# Patient Record
Sex: Female | Born: 1995 | Race: White | Hispanic: No | Marital: Married | Smoking: Never smoker
Health system: Southern US, Community
[De-identification: ages and names within clinical notes are randomized; demographics above are authoritative.]

## PROBLEM LIST (undated history)

## (undated) DIAGNOSIS — D649 Anemia, unspecified: Secondary | ICD-10-CM

## (undated) DIAGNOSIS — F32A Depression, unspecified: Secondary | ICD-10-CM

## (undated) DIAGNOSIS — F329 Major depressive disorder, single episode, unspecified: Secondary | ICD-10-CM

## (undated) HISTORY — DX: Major depressive disorder, single episode, unspecified: F32.9

## (undated) HISTORY — DX: Depression, unspecified: F32.A

## (undated) HISTORY — PX: EYE SURGERY: SHX253

---

## 2016-06-25 LAB — OB RESULTS CONSOLE GBS: GBS: POSITIVE

## 2016-06-25 LAB — OB RESULTS CONSOLE HEPATITIS B SURFACE ANTIGEN: HEP B S AG: NEGATIVE

## 2016-07-05 LAB — OB RESULTS CONSOLE GC/CHLAMYDIA
CHLAMYDIA, DNA PROBE: NEGATIVE
GC PROBE AMP, GENITAL: NEGATIVE

## 2016-09-05 ENCOUNTER — Telehealth: Payer: Self-pay | Admitting: Obstetrics & Gynecology

## 2016-09-05 ENCOUNTER — Telehealth: Payer: Self-pay | Admitting: Certified Nurse Midwife

## 2016-09-05 NOTE — Telephone Encounter (Signed)
Patient is being referred by [facility]. While scheduling appointment Patient refused to schedule X3 due to upcoming work schedule due to her moving. Then when I attempted to schedule patient with for the 4th time with an MD patient said " so I am going to be "profanity" poked by a female Doctor. Pt stated she would be transferring to an other facility.

## 2016-09-05 NOTE — Telephone Encounter (Signed)
Patient lvm to schedule new patient appointment, I called back and lvm to schedule appointment.Thank you.

## 2016-09-05 NOTE — Telephone Encounter (Signed)
Spoke with Maralyn SagoSarah and notified them of patients request to refuse transferring of care

## 2016-11-27 ENCOUNTER — Other Ambulatory Visit: Payer: Self-pay | Admitting: Nurse Practitioner

## 2016-11-27 DIAGNOSIS — Z3402 Encounter for supervision of normal first pregnancy, second trimester: Secondary | ICD-10-CM

## 2016-12-04 ENCOUNTER — Ambulatory Visit
Admission: RE | Admit: 2016-12-04 | Discharge: 2016-12-04 | Disposition: A | Discharge status: Home / Self Care | Payer: Medicaid Other | Source: Ambulatory Visit | Attending: Nurse Practitioner | Admitting: Nurse Practitioner

## 2016-12-04 DIAGNOSIS — Z3689 Encounter for other specified antenatal screening: Secondary | ICD-10-CM | POA: Insufficient documentation

## 2016-12-04 DIAGNOSIS — Z3402 Encounter for supervision of normal first pregnancy, second trimester: Secondary | ICD-10-CM

## 2016-12-05 LAB — OB RESULTS CONSOLE HIV ANTIBODY (ROUTINE TESTING): HIV: NONREACTIVE

## 2016-12-06 LAB — OB RESULTS CONSOLE RPR: RPR: NONREACTIVE

## 2017-02-01 LAB — OB RESULTS CONSOLE GC/CHLAMYDIA
Chlamydia: NEGATIVE
GC PROBE AMP, GENITAL: NEGATIVE

## 2017-02-19 NOTE — L&D Delivery Note (Signed)
       Delivery Note   Marcia Martinez is a 22 y.o. G1P0 at 850w5d Estimated Date of Delivery: 02/26/17  PRE-OPERATIVE DIAGNOSIS:  1) 8950w5d pregnancy.   POST-OPERATIVE DIAGNOSIS:  1) 8150w5d pregnancy s/p Vaginal, Spontaneous   Delivery Type: Vaginal, Spontaneous    Delivery Anesthesia: Epidural   Labor Complications:       ESTIMATED BLOOD LOSS: 175  ml    FINDINGS:   1) female infant, Apgar scores of 8    at 1 minute and 9    at 5 minutes and a birthweight of    ounces.    2) Nuchal cord: No  SPECIMENS:   PLACENTA:   Appearance:      Removal: Spontaneous      Disposition:     DISPOSITION:  Infant to left in stable condition in the delivery room, with L&D personnel and mother,  NARRATIVE SUMMARY: Labor course:  Ms. Marcia Dartingshley Delo is a G1P0 at 7550w5d who presented for induction of labor.  She progressed well in labor without pitocin.  She received the appropriate anesthesia and proceeded to complete dilation. She evidenced good maternal expulsive effort during the second stage. She went on to deliver a viable infant. The placenta delivered without problems and was noted to be complete. A perineal and vaginal examination was performed. Episiotomy/Lacerations: None  Episiotomy or lacerations were repaired with Vicryl suture using local anesthesia. The patient tolerated this well.  Elonda Huskyavid J. Evans, M.D. 02/24/2017 5:44 PM

## 2017-02-24 ENCOUNTER — Inpatient Hospital Stay: Payer: Medicaid Other | Admitting: Anesthesiology

## 2017-02-24 ENCOUNTER — Other Ambulatory Visit: Payer: Self-pay

## 2017-02-24 ENCOUNTER — Inpatient Hospital Stay
Admission: AD | Admit: 2017-02-24 | Discharge: 2017-02-26 | DRG: 807 | Disposition: A | Discharge status: Home / Self Care | Payer: Medicaid Other | Source: Ambulatory Visit | Attending: Obstetrics and Gynecology | Admitting: Obstetrics and Gynecology

## 2017-02-24 DIAGNOSIS — O99824 Streptococcus B carrier state complicating childbirth: Principal | ICD-10-CM | POA: Diagnosis present

## 2017-02-24 DIAGNOSIS — Z3483 Encounter for supervision of other normal pregnancy, third trimester: Secondary | ICD-10-CM | POA: Diagnosis present

## 2017-02-24 DIAGNOSIS — Z3A39 39 weeks gestation of pregnancy: Secondary | ICD-10-CM

## 2017-02-24 HISTORY — DX: Anemia, unspecified: D64.9

## 2017-02-24 LAB — CBC
HEMATOCRIT: 33.4 % — AB (ref 35.0–47.0)
HEMOGLOBIN: 11.4 g/dL — AB (ref 12.0–16.0)
MCH: 31.6 pg (ref 26.0–34.0)
MCHC: 34.2 g/dL (ref 32.0–36.0)
MCV: 92.5 fL (ref 80.0–100.0)
Platelets: 257 10*3/uL (ref 150–440)
RBC: 3.61 MIL/uL — ABNORMAL LOW (ref 3.80–5.20)
RDW: 14.1 % (ref 11.5–14.5)
WBC: 11.6 10*3/uL — ABNORMAL HIGH (ref 3.6–11.0)

## 2017-02-24 LAB — TYPE AND SCREEN
ABO/RH(D): A POS
Antibody Screen: NEGATIVE

## 2017-02-24 LAB — RAPID HIV SCREEN (HIV 1/2 AB+AG)
HIV 1/2 ANTIBODIES: NONREACTIVE
HIV-1 P24 Antigen - HIV24: NONREACTIVE

## 2017-02-24 MED ORDER — EPHEDRINE 5 MG/ML INJ
10.0000 mg | INTRAVENOUS | Status: DC | PRN
  Filled 2017-02-24: qty 2

## 2017-02-24 MED ORDER — DIPHENHYDRAMINE HCL 50 MG/ML IJ SOLN: 12.5000 mg | INTRAMUSCULAR | Status: DC | PRN

## 2017-02-24 MED ORDER — SIMETHICONE 80 MG PO CHEW: 80.0000 mg | CHEWABLE_TABLET | ORAL | Status: DC | PRN

## 2017-02-24 MED ORDER — FENTANYL 2.5 MCG/ML W/ROPIVACAINE 0.15% IN NS 100 ML EPIDURAL (ARMC)
EPIDURAL | Status: AC
  Filled 2017-02-24: qty 100

## 2017-02-24 MED ORDER — CLINDAMYCIN PHOSPHATE 900 MG/50ML IV SOLN
900.0000 mg | Freq: Once | INTRAVENOUS | Status: AC
  Administered 2017-02-24: 900 mg via INTRAVENOUS
  Filled 2017-02-24: qty 50

## 2017-02-24 MED ORDER — MISOPROSTOL 200 MCG PO TABS
ORAL_TABLET | ORAL | Status: AC
  Filled 2017-02-24: qty 4

## 2017-02-24 MED ORDER — OXYTOCIN BOLUS FROM INFUSION
500.0000 mL | Freq: Once | INTRAVENOUS | Status: AC
  Administered 2017-02-24: 500 mL via INTRAVENOUS

## 2017-02-24 MED ORDER — SOD CITRATE-CITRIC ACID 500-334 MG/5ML PO SOLN: 30.0000 mL | ORAL | Status: DC | PRN

## 2017-02-24 MED ORDER — TETANUS-DIPHTH-ACELL PERTUSSIS 5-2.5-18.5 LF-MCG/0.5 IM SUSP: 0.5000 mL | Freq: Once | INTRAMUSCULAR | Status: DC

## 2017-02-24 MED ORDER — IBUPROFEN 600 MG PO TABS
600.0000 mg | ORAL_TABLET | Freq: Four times a day (QID) | ORAL | Status: DC
  Administered 2017-02-24 – 2017-02-26 (×7): 600 mg via ORAL
  Filled 2017-02-24 (×8): qty 1

## 2017-02-24 MED ORDER — DOCUSATE SODIUM 100 MG PO CAPS
100.0000 mg | ORAL_CAPSULE | Freq: Two times a day (BID) | ORAL | Status: DC
  Administered 2017-02-24 – 2017-02-26 (×4): 100 mg via ORAL
  Filled 2017-02-24 (×4): qty 1

## 2017-02-24 MED ORDER — OXYCODONE-ACETAMINOPHEN 5-325 MG PO TABS: 1.0000 | ORAL_TABLET | ORAL | Status: DC | PRN

## 2017-02-24 MED ORDER — OXYTOCIN 10 UNIT/ML IJ SOLN
INTRAMUSCULAR | Status: AC
  Filled 2017-02-24: qty 2

## 2017-02-24 MED ORDER — LACTATED RINGERS IV SOLN: 500.0000 mL | Freq: Once | INTRAVENOUS | Status: DC

## 2017-02-24 MED ORDER — FENTANYL 2.5 MCG/ML W/ROPIVACAINE 0.15% IN NS 100 ML EPIDURAL (ARMC)
12.0000 mL/h | EPIDURAL | Status: DC
  Administered 2017-02-24: 12 mL/h via EPIDURAL

## 2017-02-24 MED ORDER — ZOLPIDEM TARTRATE 5 MG PO TABS: 5.0000 mg | ORAL_TABLET | Freq: Every evening | ORAL | Status: DC | PRN

## 2017-02-24 MED ORDER — PHENYLEPHRINE 40 MCG/ML (10ML) SYRINGE FOR IV PUSH (FOR BLOOD PRESSURE SUPPORT)
80.0000 ug | PREFILLED_SYRINGE | INTRAVENOUS | Status: DC | PRN
  Filled 2017-02-24: qty 5

## 2017-02-24 MED ORDER — DIPHENHYDRAMINE HCL 25 MG PO CAPS: 25.0000 mg | ORAL_CAPSULE | Freq: Four times a day (QID) | ORAL | Status: DC | PRN

## 2017-02-24 MED ORDER — AMMONIA AROMATIC IN INHA
RESPIRATORY_TRACT | Status: AC
  Filled 2017-02-24: qty 10

## 2017-02-24 MED ORDER — ACETAMINOPHEN 325 MG PO TABS: 650.0000 mg | ORAL_TABLET | ORAL | Status: DC | PRN

## 2017-02-24 MED ORDER — ONDANSETRON HCL 4 MG/2ML IJ SOLN: 4.0000 mg | Freq: Four times a day (QID) | INTRAMUSCULAR | Status: DC | PRN

## 2017-02-24 MED ORDER — PRENATAL MULTIVITAMIN CH
1.0000 | ORAL_TABLET | Freq: Every day | ORAL | Status: DC
  Administered 2017-02-25: 1 via ORAL
  Filled 2017-02-24: qty 1

## 2017-02-24 MED ORDER — LACTATED RINGERS IV SOLN: INTRAVENOUS | Status: DC

## 2017-02-24 MED ORDER — LIDOCAINE HCL (PF) 1 % IJ SOLN
30.0000 mL | INTRAMUSCULAR | Status: DC | PRN
  Administered 2017-02-24: 30 mL via SUBCUTANEOUS
  Filled 2017-02-24: qty 30

## 2017-02-24 MED ORDER — BENZOCAINE-MENTHOL 20-0.5 % EX AERO
1.0000 "application " | INHALATION_SPRAY | CUTANEOUS | Status: DC | PRN
  Filled 2017-02-24: qty 56

## 2017-02-24 MED ORDER — OXYTOCIN 40 UNITS IN LACTATED RINGERS INFUSION - SIMPLE MED
2.5000 [IU]/h | INTRAVENOUS | Status: DC | PRN
  Filled 2017-02-24: qty 1000

## 2017-02-24 MED ORDER — MISOPROSTOL 50MCG HALF TABLET
ORAL_TABLET | ORAL | Status: AC
  Administered 2017-02-24: 50 ug via VAGINAL
  Filled 2017-02-24: qty 1

## 2017-02-24 MED ORDER — SODIUM CHLORIDE 0.9 % IV SOLN
2.0000 g | INTRAVENOUS | Status: AC
  Administered 2017-02-24 (×2): 2 g via INTRAVENOUS
  Filled 2017-02-24 (×2): qty 2000

## 2017-02-24 MED ORDER — OXYTOCIN 40 UNITS IN LACTATED RINGERS INFUSION - SIMPLE MED
2.5000 [IU]/h | INTRAVENOUS | Status: DC
  Filled 2017-02-24 (×2): qty 1000

## 2017-02-24 MED ORDER — LACTATED RINGERS IV SOLN
500.0000 mL | INTRAVENOUS | Status: DC | PRN
  Administered 2017-02-24: 1000 mL via INTRAVENOUS

## 2017-02-24 MED ORDER — MISOPROSTOL 25 MCG QUARTER TABLET
50.0000 ug | ORAL_TABLET | ORAL | Status: DC | PRN
  Administered 2017-02-24: 50 ug via VAGINAL
  Filled 2017-02-24: qty 1

## 2017-02-24 MED ORDER — LACTATED RINGERS IV SOLN
INTRAVENOUS | Status: DC
  Administered 2017-02-24: 07:00:00 via INTRAVENOUS

## 2017-02-24 MED ORDER — LIDOCAINE HCL (PF) 1 % IJ SOLN
INTRAMUSCULAR | Status: DC | PRN
  Administered 2017-02-24: 3 mL

## 2017-02-24 MED ORDER — SODIUM CHLORIDE 0.9 % IV SOLN
INTRAVENOUS | Status: DC | PRN
  Administered 2017-02-24 (×3): 5 mL via EPIDURAL

## 2017-02-24 NOTE — Progress Notes (Signed)
LABOR NOTE   Marcia Martinez 21 y.o.GP@ at 2945w5d Early latent labor.  SUBJECTIVE:  Pt mildly uncomfortable with occ contractions.  OBJECTIVE:  BP 108/65   Pulse 97   Temp 98.5 F (36.9 C) (Oral)   Resp 16   Ht 5\' 4"  (1.626 m)   Wt 192 lb (87.1 kg)   BMI 32.96 kg/m  No intake/output data recorded.  She has shown cervical change. CERVIX: 3 cm:  75%:   -3:   mid position:   soft SVE:   Dilation: 3 Effacement (%): 80 Exam by:: Dr.Evans CONTRACTIONS: irregular, every 5 minutes FHR: Fetal heart tracing reviewed. Variability: Good {> 6 bpm) Category I   Analgesia: Labor support without medications  Labs: Lab Results  Component Value Date   WBC 11.6 (H) 02/24/2017   HGB 11.4 (L) 02/24/2017   HCT 33.4 (L) 02/24/2017   MCV 92.5 02/24/2017   PLT 257 02/24/2017    AROM - clear fluid noted  50mcg placed intravaginally.  ASSESSMENT: 1) Labor curve reviewed.       Progress: Early latent labor.     Membranes: intact, ruptured       PLAN: continue present management  Marcia Martinez, M.D. 02/24/2017 1:21 PM

## 2017-02-24 NOTE — Plan of Care (Addendum)
Pt. Admitted to room 341. Alert and oriented with apropriate affect. Color good, skin w&d. BBS clear. HR sl. Tachy. Will reassess with next hourly check. All other VSS Pt. Is eating and tolerating well. Encouraged her to increase P.O. Fluids. Fundus is Firm and Lochia is small in amount. Oriented Pt. And FOB to room and PP as well as NB Education initiated and both v/o. Plan Of Care discussed and Pt. Is in agreement. Adult and Infant Fall Policies instructed and Pt. V/O and signed Falls Agreement.

## 2017-02-24 NOTE — Anesthesia Preprocedure Evaluation (Signed)
Anesthesia Evaluation  Patient identified by MRN, date of birth, ID band Patient awake    Reviewed: Allergy & Precautions, NPO status , Patient's Chart, lab work & pertinent test results  History of Anesthesia Complications Negative for: history of anesthetic complications  Airway Mallampati: II  TM Distance: >3 FB Neck ROM: Full    Dental no notable dental hx.    Pulmonary neg pulmonary ROS, neg sleep apnea, neg COPD,    breath sounds clear to auscultation- rhonchi (-) wheezing      Cardiovascular Exercise Tolerance: Good (-) hypertension(-) CAD and (-) Past MI  Rhythm:Regular Rate:Normal - Systolic murmurs and - Diastolic murmurs    Neuro/Psych negative neurological ROS  negative psych ROS   GI/Hepatic negative GI ROS, Neg liver ROS,   Endo/Other  negative endocrine ROSneg diabetes  Renal/GU negative Renal ROS     Musculoskeletal negative musculoskeletal ROS (+)   Abdominal (+) + obese,   Peds  Hematology  (+) anemia ,   Anesthesia Other Findings   Reproductive/Obstetrics (+) Pregnancy                             Lab Results  Component Value Date   WBC 11.6 (H) 02/24/2017   HGB 11.4 (L) 02/24/2017   HCT 33.4 (L) 02/24/2017   MCV 92.5 02/24/2017   PLT 257 02/24/2017    Anesthesia Physical Anesthesia Plan  ASA: II  Anesthesia Plan: Epidural   Post-op Pain Management:    Induction:   PONV Risk Score and Plan: 2  Airway Management Planned:   Additional Equipment:   Intra-op Plan:   Post-operative Plan:   Informed Consent: I have reviewed the patients History and Physical, chart, labs and discussed the procedure including the risks, benefits and alternatives for the proposed anesthesia with the patient or authorized representative who has indicated his/her understanding and acceptance.     Plan Discussed with: CRNA and Anesthesiologist  Anesthesia Plan Comments:  (Plan for epidural for labor, discussed epidural vs spinal vs GA if need for csection)        Anesthesia Quick Evaluation

## 2017-02-24 NOTE — H&P (Signed)
    History and Physical   HPI  Marcia Martinez is a 22 y.o. G1P0 at 4169w0d Estimated Date of Delivery: 02/17/17 who is being admitted for  induction of labor   OB History  Obstetric History   G1   P0   T0   P0   A0   L0    SAB0   TAB0   Ectopic0   Multiple0   Live Births0     # Outcome Date GA Lbr Len/2nd Weight Sex Delivery Anes PTL Lv  1 Current               PROBLEM LIST  Pregnancy complications or risks: There are no active problems to display for this patient.   Prenatal labs and studies: ABO, Rh:   Antibody:   Rubella:   RPR: Nonreactive (10/18 0000)  HBsAg: Negative (05/07 0000)  HIV: Non-reactive (10/17 0000)  WUJ:WJXBJYNWGBS:Positive (05/07 0000)   Past Medical History:  Diagnosis Date  . Anemia      Past Surgical History:  Procedure Laterality Date  . EYE SURGERY Left      Medications      Medication List    ASK your doctor about these medications   ferrous sulfate 325 (65 FE) MG tablet   multivitamin-prenatal 27-0.8 MG Tabs tablet        Allergies  Patient has no allergy information on record.  Review of Systems  Pertinent items noted in HPI and remainder of comprehensive ROS otherwise negative.  Physical Exam  BP 116/79   Pulse (!) 104   Temp 97.7 F (36.5 C) (Oral)   Resp 16   Ht 5\' 4"  (1.626 m)   Wt 192 lb (87.1 kg)   BMI 32.96 kg/m   Lungs:  CTA B Cardio: RRR without M/R/G Abd: Soft, gravid, NT Presentation: cephalic EXT: No C/C/ 1+ Edema DTRs: 2+ B CERVIX: 0.5 cm  :  Long:   -3:    mid position:    moderate  See Prenatal records for more detailed PE.     FHR:  Variability: Good {> 6 bpm)  Toco: Uterine Contractions: None   Test Results  No results found for this or any previous visit (from the past 24 hour(s)). Group B Strep positive  Assessment   G1P0 at 4169w0d Estimated Date of Delivery: 02/17/17  The fetus is reassuring.   There are no active problems to display for this patient. After extensive  review of [facility] records and U/S s  Show that her bests EDC is 02-26-17.  I have discussed this in detail with the patient and family.  I have discussed elective induction versus expectant management.  The risks of elective induction were specified as increased risk for CD, increase stress on baby, longer labor with increased risk of infection.  The pt and family discussed this in detail and after they reached a decision they informed us that they understood that this was not medically indicated but they wanted to proceed with elective induction.  Plan  1. Admit to L&D :   For cytotec induction 2. EFM: -- Category 1 3. Epidural if desired. Stadol for IV pain until epidural requested. 4. Admission labs    Elonda Huskyavid J. Evans, M.D. 02/24/2017 9:11 AM

## 2017-02-24 NOTE — Anesthesia Procedure Notes (Signed)
Epidural Patient location during procedure: OB Start time: 02/24/2017 2:18 PM End time: 02/24/2017 2:36 PM  Staffing Anesthesiologist: Alver FisherPenwarden, Yahayra Geis, MD Performed: anesthesiologist   Preanesthetic Checklist Completed: patient identified, site marked, surgical consent, pre-op evaluation, timeout performed, IV checked, risks and benefits discussed and monitors and equipment checked  Epidural Patient position: sitting Prep: ChloraPrep Patient monitoring: heart rate, continuous pulse ox and blood pressure Approach: midline Location: L3-L4 Injection technique: LOR saline  Needle:  Needle type: Tuohy  Needle gauge: 18 G Needle length: 9 cm and 9 Needle insertion depth: 6 cm Catheter type: closed end flexible Catheter size: 20 Guage Catheter at skin depth: 10 cm Test dose: negative (0.125% bupivacaine)  Assessment Events: blood not aspirated, injection not painful, no injection resistance, negative IV test and no paresthesia  Additional Notes   Patient tolerated the insertion well without complications.Reason for block:procedure for pain

## 2017-02-25 NOTE — Progress Notes (Signed)
Patient ID: Marcia Martinez, female   DOB: 05/09/1995, 22 y.o.   MRN: [id number]  Progress Note - Vaginal Delivery  Marcia Martinez is a 22 y.o. G1P0 now PP day 1 s/p Vaginal, Spontaneous .   Subjective:  The patient reports no complaints, up ad lib, voiding and tolerating PO  Objective:  Vital signs in last 24 hours: Temp:  [97.6 F (36.4 C)-99 F (37.2 C)] 97.8 F (36.6 C) (01/07 0800) Pulse Rate:  [91-123] 91 (01/07 0800) Resp:  [14-20] 18 (01/07 0800) BP: (105-142)/(67-98) 119/72 (01/07 0800) SpO2:  [96 %-100 %] 98 % (01/07 0800)  Physical Exam:  General: alert and cooperative Lochia: appropriate Uterine Fundus: firm DVT Evaluation: No evidence of DVT seen on physical exam.    Data Review Recent Labs    02/24/17 0936  HGB 11.4*  HCT 33.4*    Assessment/Plan: Active Problems:   * No active hospital problems. *   Plan for discharge tomorrow  -- Continue routine PP care.     Elonda Huskyavid J. Evans, M.D. 02/25/2017 1:13 PM

## 2017-02-25 NOTE — Anesthesia Postprocedure Evaluation (Signed)
Anesthesia Post Note  Patient: Marcia Martinez  Procedure(s) Performed: AN AD HOC LABOR EPIDURAL  Patient location during evaluation: Mother Baby Anesthesia Type: Epidural Level of consciousness: awake and alert and oriented Pain management: pain level controlled Vital Signs Assessment: post-procedure vital signs reviewed and stable Respiratory status: respiratory function stable Cardiovascular status: stable Postop Assessment: epidural receding, no headache, no backache, no apparent nausea or vomiting, patient able to bend at knees and adequate PO intake Anesthetic complications: no     Last Vitals:  Vitals:   02/25/17 0005 02/25/17 0342  BP: 123/74 121/78  Pulse: (!) 107 99  Resp: 18 18  Temp: 36.8 C 36.4 C  SpO2: 97% 99%    Last Pain:  Vitals:   02/25/17 0445  TempSrc:   PainSc: Marcia Martinez                 Leita Lindbloom D

## 2017-02-25 NOTE — Lactation Note (Signed)
This note was copied from a baby's chart. Lactation Consultation Note  Patient Name: Marcia Martinez    Mom states that this feeding feels better with nipple shield and even better than that with colostrum inserted in nipple shield. Baby tends to keep sliding back, hurting Mom and blanching nipple. Due to tight tongue frenulum, she may benefit from assessment from expert as she may benefit from laser treatment if latch remains poor despite perfect position and mom trying best latch possible. Both parents have had (have tongue or lip ties that needed treatment, so parents are not surprised and sound eager to have it investigated by someone who can diagnose and treat. I encouraged them to discuss with their pediatrician so they can be aware and F/U as needed. I gave them some handouts, but it has been a chaotic day and I will review tomorrow.   Mom's left areola has been so edematous all day and nipple flattens out so much with compression that I don't want her to even try to nurse her on that side tonight. I gave her breast shells to wear in bra and encouraged her to pump/hand express every 2-3 hours for induce supply and evert nipple. Mom has expressed out 1 ml at one feeding and 5 ml just now. Baby's VSS and plenty of diapers, but she is increasingly fussy for more food. I did discuss the possibililty that she may need more volume than what she can give baby at this moment in time (from so many poor feeds and delayed breast stimulation), that a supplement may be needed. I reviewed her options of formula vs donor (if MD agrees to order it). Mom is voicing interest in donor milk if supplement is needed while she works on building her own supply. Mom tried 30 mm flange on left side, but it is too big. She is to try 27 next time and have LC assess fit.  PLAN written on board in her room:  Wear shells in bra: may use 24 mm nipple shield and nurse on right side only per cues and pump  left side at least every 3 hours. If supp needed, may consider order for donor milk, starting with approx 10 ml (adjust volume as needed).   Maternal Data    Feeding Feeding Type: Breast Fed  LATCH Score                   Interventions    Lactation Tools Discussed/Used Tools: Pump;102F feeding tube / Syringe   Consult Status      Marcia Martinez Martinez, 6:07 PM

## 2017-02-26 LAB — RPR: RPR Ser Ql: NONREACTIVE

## 2017-02-26 NOTE — Discharge Summary (Signed)
                              Discharge Summary  Date of Admission: 02/24/2017  Date of Discharge: 02/26/2017  Admitting Diagnosis: Induction of labor at 39weeks and 5days  Mode of Delivery:  vaginal delivery                 Discharge Diagnosis: No other diagnosis   Intrapartum Procedures: Atificial rupture of membranes, epidural and episiotomy 2nd   Post partum procedures:   Complications: none                      Discharge Day SOAP Note:  Progress Note - Vaginal Delivery  Marcia Martinez is a 22 y.o. G1P0 now PP day 2 s/p Vaginal, Spontaneous . Delivery was uncomplicated  Subjective  The patient has the following complaints: has no unusual complaints  Pain is controlled with current medications.   Patient is urinating without difficulty.  She is ambulating well.    Objective  Vital signs: BP 131/84 (BP Location: Left Arm)   Pulse 66   Temp 97.8 F (36.6 C) (Oral)   Resp 18   Ht 5\' 4"  (1.626 m)   Wt 192 lb (87.1 kg)   SpO2 100%   BMI 32.96 kg/m   Physical Exam: Gen: NAD Fundus Fundal Tone: Firm  Lochia Amount: Small  Perineum Appearance: Edematous     Data Review Labs: CBC Latest Ref Rng & Units 02/24/2017  WBC 3.6 - 11.0 K/uL 11.6(H)  Hemoglobin 12.0 - 16.0 g/dL 11.4(L)  Hematocrit 35.0 - 47.0 % 33.4(L)  Platelets 150 - 440 K/uL 257   A POS  Assessment/Plan  Active Problems:   * No active hospital problems. *    Plan for discharge today.   Discharge Instructions: Per After Visit Summary. Activity: Advance as tolerated. Pelvic rest for 6 weeks.  Also refer to After Visit Summary Diet: Regular Medications: Allergies as of 02/26/2017   No Known Allergies     Medication List    TAKE these medications   ferrous sulfate 325 (65 FE) MG tablet Take 325 mg by mouth daily with breakfast.   multivitamin-prenatal 27-0.8 MG Tabs tablet Take 1 tablet by mouth daily at 12 noon.      Outpatient follow up:  Follow-up Information    Linzie CollinEvans, Jamillia Closson  James, MD Follow up in 6 week(s).   Specialty:  Obstetrics and Gynecology Contact information: [address] [place] [address] [phone]          Postpartum contraception: Will discuss at first office visit post-partum  Discharged Condition: good  Discharged to: home  Newborn Data: Disposition:home with mother  Apgars: APGAR (1 MIN): 8   APGAR (5 MINS): 9   APGAR (10 MINS):    Baby Feeding: Bottle    Elonda Huskyavid J. Enora Trillo, M.D. 02/26/2017 9:30 AM

## 2017-02-26 NOTE — Lactation Note (Signed)
Lactation Consultation Note  Patient Name: Marcia Martinez Today's Date: 02/26/2017    [facility], Mom says that she is exhausted and wants to just formula feed despite being instructed on differences between human milk and formula and having had a feeding. She "waffled" on what she planned to do at home with feedings as far as still trying to breastfeed or pump at all or not. I encouraged her to review her options carefully as the plan of care would change depending on the path she wants to take. I told her what she needs to do if she really wants a good milk supply or what other things she needs to do if she wants her milk to dry up but not get engorged. She does have a personal DEBP for home use. She has BF booklet with info on all we have discussed; she has LC and [facility] contact info as well as Dr. Lexine BatonHisaw info if she wants him to assess tongue and lip issues.    Maternal Data    Feeding    LATCH Score                   Interventions    Lactation Tools Discussed/Used     Consult Status      Marcia CornSandra Clark [name] 02/26/2017, 11:15 AM

## 2017-02-26 NOTE — Progress Notes (Signed)
Pt discharged with infant.  Discharge instructions, prescriptions and follow up appointment given to and reviewed with pt. Pt verbalized understanding. Escorted out by auxillary. 

## 2017-04-15 ENCOUNTER — Encounter: Payer: Medicaid Other | Admitting: Obstetrics and Gynecology

## 2017-04-17 ENCOUNTER — Encounter: Payer: Self-pay | Admitting: Obstetrics and Gynecology

## 2017-04-17 ENCOUNTER — Ambulatory Visit (INDEPENDENT_AMBULATORY_CARE_PROVIDER_SITE_OTHER): Payer: Medicaid Other | Admitting: Obstetrics and Gynecology

## 2017-04-17 NOTE — Progress Notes (Signed)
HPI:      [name] is a 22 y.o. G1P0 who LMP was No LMP recorded.  Subjective:   She presents today approximately 6 weeks postpartum.  She has resumed all normal activities including intercourse and riding horses.  She states that she developed postpartum depression and saw her family doctor for this.  She says she was placed on an antidepressant which helped and she continues to take.  She does not know what that antidepressant medication is.   She is bottlefeeding. She has resumed intercourse without problem.  Her "other doctor" started her on OCPs which she is taking daily.   Hx: The following portions of the patient's history were reviewed and updated as appropriate:             She  has a past medical history of Anemia. She does not have a problem list on file. She  has a past surgical history that includes Eye surgery (Left). Her family history is not on file. She  reports that  has never smoked. she has never used smokeless tobacco. She reports that she does not drink alcohol or use drugs. She has a current medication list which includes the following prescription(s): multivitamin-prenatal and ferrous sulfate. She has No Known Allergies.       Review of Systems:  Review of Systems  Constitutional: Denied constitutional symptoms, night sweats, recent illness, fatigue, fever, insomnia and weight loss.  Eyes: Denied eye symptoms, eye pain, photophobia, vision change and visual disturbance.  Ears/Nose/Throat/Neck: Denied ear, nose, throat or neck symptoms, hearing loss, nasal discharge, sinus congestion and sore throat.  Cardiovascular: Denied cardiovascular symptoms, arrhythmia, chest pain/pressure, edema, exercise intolerance, orthopnea and palpitations.  Respiratory: Denied pulmonary symptoms, asthma, pleuritic pain, productive sputum, cough, dyspnea and wheezing.  Gastrointestinal: Denied, gastro-esophageal reflux, melena, nausea and vomiting.  Genitourinary: Denied  genitourinary symptoms including symptomatic vaginal discharge, pelvic relaxation issues, and urinary complaints.  Musculoskeletal: Denied musculoskeletal symptoms, stiffness, swelling, muscle weakness and myalgia.  Dermatologic: Denied dermatology symptoms, rash and scar.  Neurologic: Denied neurology symptoms, dizziness, headache, neck pain and syncope.  Psychiatric: Denied psychiatric symptoms, anxiety and depression.  Endocrine: Denied endocrine symptoms including hot flashes and night sweats.   Meds:   Current Outpatient Medications on File Prior to Visit  Medication Sig Dispense Refill  . Prenatal Vit-Fe Fumarate-FA (MULTIVITAMIN-PRENATAL) 27-0.8 MG TABS tablet Take 1 tablet by mouth daily at 12 noon.    . ferrous sulfate 325 (65 FE) MG tablet Take 325 mg by mouth daily with breakfast.     No current facility-administered medications on file prior to visit.     Objective:     Vitals:   04/17/17 1352  BP: 127/86  Pulse: 79              Pelvic examination   Pelvic:   Vulva: Normal appearance.  No lesions.  No abnormal scarring.    Vagina: No lesions or abnormalities noted.  Support: Normal pelvic support.  Urethra No masses tenderness or scarring.  Meatus Normal size without lesions or prolapse.  Cervix: Normal ectropion.  No lesions.  Anus: Normal exam.  No lesions.  Perineum: Normal exam.  No lesions.  Healed well.          Bimanual   Uterus: Normal size.  Non-tender.  Mobile.  AV.  Adnexae: No masses.  Non-tender to palpation.  Cul-de-sac: Negative for abnormality.     Assessment:    G1P0 There are no active problems to  display for this patient.    1. Postpartum care and examination immediately after delivery     Normal exam postpartum  Patient gives a history of postpartum depression which is now resolved using medication.  Patient currently using OCPs for birth control.   Plan:            1.  Follow-up in 4 months for annual examination and Pap  smear. Orders No orders of the defined types were placed in this encounter.   No orders of the defined types were placed in this encounter.     F/U  Return in about 4 months (around 08/15/2017).  Elonda Huskyavid J. Cleston Lautner, M.D. 04/17/2017 2:12 PM

## 2017-08-15 ENCOUNTER — Ambulatory Visit (INDEPENDENT_AMBULATORY_CARE_PROVIDER_SITE_OTHER): Payer: Medicaid Other | Admitting: Obstetrics and Gynecology

## 2017-08-15 ENCOUNTER — Encounter: Payer: Medicaid Other | Admitting: Obstetrics and Gynecology

## 2017-08-15 ENCOUNTER — Encounter: Payer: Self-pay | Admitting: Obstetrics and Gynecology

## 2017-08-15 VITALS — BP 106/75 | HR 97 | Ht 65.0 in | Wt 190.4 lb

## 2017-08-15 DIAGNOSIS — Z309 Encounter for contraceptive management, unspecified: Secondary | ICD-10-CM

## 2017-08-15 DIAGNOSIS — Z01419 Encounter for gynecological examination (general) (routine) without abnormal findings: Secondary | ICD-10-CM

## 2017-08-15 NOTE — Progress Notes (Signed)
Pt is present today for her annual exam.

## 2017-08-15 NOTE — Progress Notes (Signed)
HPI:      Ms. Ressie Slevin is a 22 y.o. G1P0 who LMP was Patient's last menstrual period was 07/25/2017 (lmp unknown).  Subjective:   She presents today for her annual examination.  She is using OCPs and having regular cycles.  She is looking for another form of birth control and specifically asking questions about Mirena.    Hx: The following portions of the patient's history were reviewed and updated as appropriate:             She  has a past medical history of Anemia and Depression. She does not have a problem list on file. She  has a past surgical history that includes Eye surgery (Left). Her family history is not on file. She  reports that she has never smoked. She has never used smokeless tobacco. She reports that she drinks alcohol. She reports that she does not use drugs. She has a current medication list which includes the following prescription(s): norgestimate-ethinyl estradiol. She has No Known Allergies.       Review of Systems:  Review of Systems  Constitutional: Denied constitutional symptoms, night sweats, recent illness, fatigue, fever, insomnia and weight loss.  Eyes: Denied eye symptoms, eye pain, photophobia, vision change and visual disturbance.  Ears/Nose/Throat/Neck: Denied ear, nose, throat or neck symptoms, hearing loss, nasal discharge, sinus congestion and sore throat.  Cardiovascular: Denied cardiovascular symptoms, arrhythmia, chest pain/pressure, edema, exercise intolerance, orthopnea and palpitations.  Respiratory: Denied pulmonary symptoms, asthma, pleuritic pain, productive sputum, cough, dyspnea and wheezing.  Gastrointestinal: Denied, gastro-esophageal reflux, melena, nausea and vomiting.  Genitourinary: Denied genitourinary symptoms including symptomatic vaginal discharge, pelvic relaxation issues, and urinary complaints.  Musculoskeletal: Denied musculoskeletal symptoms, stiffness, swelling, muscle weakness and myalgia.  Dermatologic: Denied  dermatology symptoms, rash and scar.  Neurologic: Denied neurology symptoms, dizziness, headache, neck pain and syncope.  Psychiatric: Denied psychiatric symptoms, anxiety and depression.  Endocrine: Denied endocrine symptoms including hot flashes and night sweats.   Meds:   Current Outpatient Medications on File Prior to Visit  Medication Sig Dispense Refill  . norgestimate-ethinyl estradiol (ORTHO-CYCLEN,SPRINTEC,PREVIFEM) 0.25-35 MG-MCG tablet Take 1 tablet by mouth daily.     No current facility-administered medications on file prior to visit.     Objective:     Vitals:   08/15/17 1135  BP: 106/75  Pulse: 97              Physical examination General NAD, Conversant  HEENT Atraumatic; Op clear with mmm.  Normo-cephalic. Pupils reactive. Anicteric sclerae  Thyroid/Neck Smooth without nodularity or enlargement. Normal ROM.  Neck Supple.  Skin No rashes, lesions or ulceration. Normal palpated skin turgor. No nodularity.  Breasts: No masses or discharge.  Symmetric.  No axillary adenopathy.  Lungs: Clear to auscultation.No rales or wheezes. Normal Respiratory effort, no retractions.  Heart: NSR.  No murmurs or rubs appreciated. No periferal edema  Abdomen: Soft.  Non-tender.  No masses.  No HSM. No hernia  Extremities: Moves all appropriately.  Normal ROM for age. No lymphadenopathy.  Neuro: Oriented to PPT.  Normal mood. Normal affect.     Pelvic:   Vulva: Normal appearance.  No lesions.  Vagina: No lesions or abnormalities noted.  Support: Normal pelvic support.  Urethra No masses tenderness or scarring.  Meatus Normal size without lesions or prolapse.  Cervix: Normal appearance.  No lesions.  Anus: Normal exam.  No lesions.  Perineum: Normal exam.  No lesions.        Bimanual  Uterus: Normal size.  Non-tender.  Mobile.  AV.  Adnexae: No masses.  Non-tender to palpation.  Cul-de-sac: Negative for abnormality.      Assessment:    G1P0 There are no active  problems to display for this patient.    1. Encounter for well woman exam with routine gynecological exam     Normal exam   Plan:            1.  Basic Screening Recommendations The basic screening recommendations for asymptomatic women were discussed with the patient during her visit.  The age-appropriate recommendations were discussed with her and the rational for the tests reviewed.  When I am informed by the patient that another primary care physician has previously obtained the age-appropriate tests and they are up-to-date, only outstanding tests are ordered and referrals given as necessary.  Abnormal results of tests will be discussed with her when all of her results are completed. Pap-GC/CT performed.  2.  Birth Control I discussed multiple birth control options and methods with the patient.  The risks and benefits of each were reviewed. IUD Literature on Mirena given.  Risks and benefits discussed.  She is considering IUD as an option for birth/cycle control.  Patient to contact us if she decides upon Mirena and we will insert it with a menstrual period.  Orders No orders of the defined types were placed in this encounter.   No orders of the defined types were placed in this encounter.       F/U  No follow-ups on file.  Elonda Huskyavid J. Maykel Reitter, M.D. 08/15/2017 12:23 PM

## 2017-08-21 LAB — PAP IG, CT-NG, RFX HPV ASCU
Chlamydia, Nuc. Acid Amp: NEGATIVE
Gonococcus by Nucleic Acid Amp: NEGATIVE
PAP SMEAR COMMENT: 0

## 2019-05-24 IMAGING — US US OB COMP +14 WK
1 series · 13 of 28 positions shown · non-contrast
Comparison: none

CLINICAL DATA: Current assigned gestational age of 28 weeks 0 days
by prior outside ultrasound. Evaluate fetal anatomy and growth.

EXAM:
OBSTETRICAL ULTRASOUND >14 WKS

[Series 1: us ob comp +14 wk · 0.22mm/px · 13 of 98 slices shown]
[im 4/98]
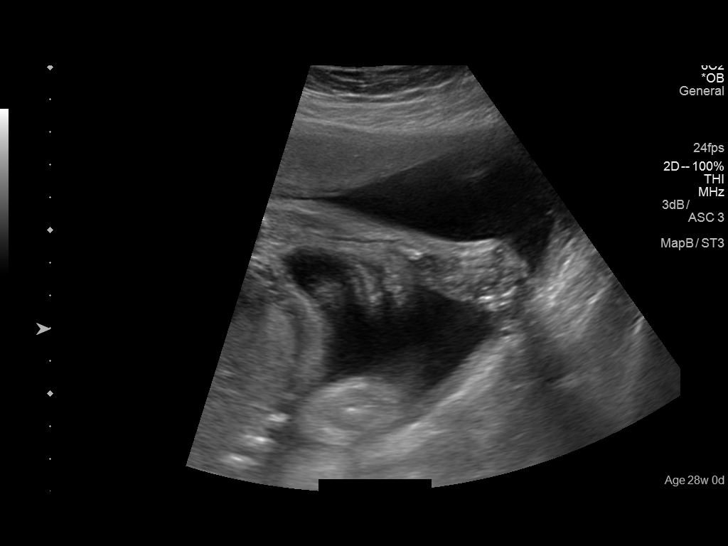
[im 11/98]
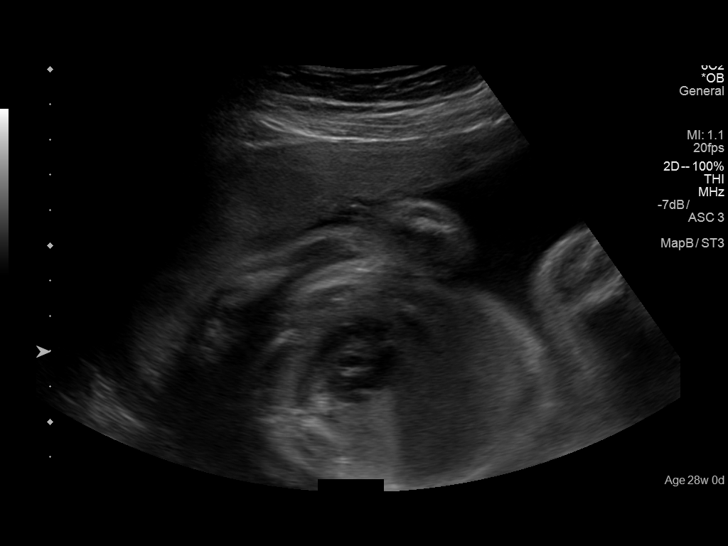
[im 18/98]
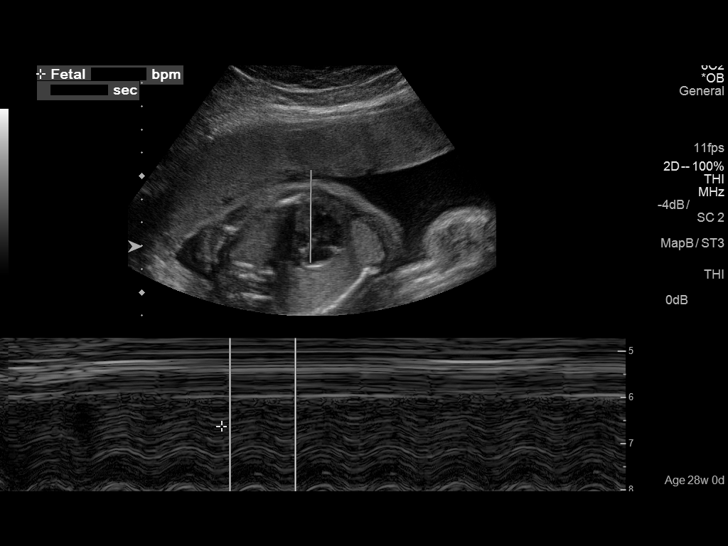
[im 26/98]
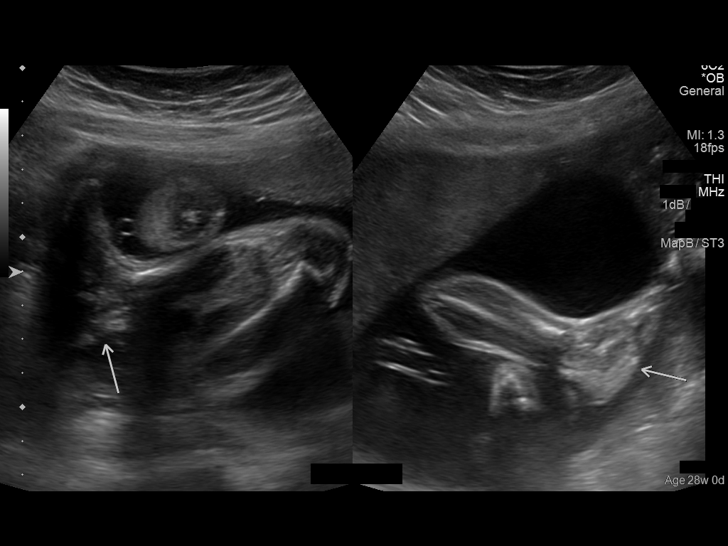
[im 33/98]
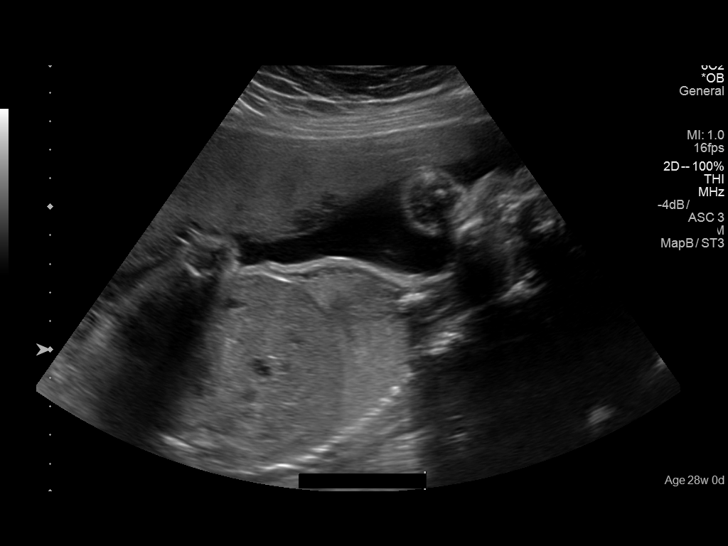
[im 40/98]
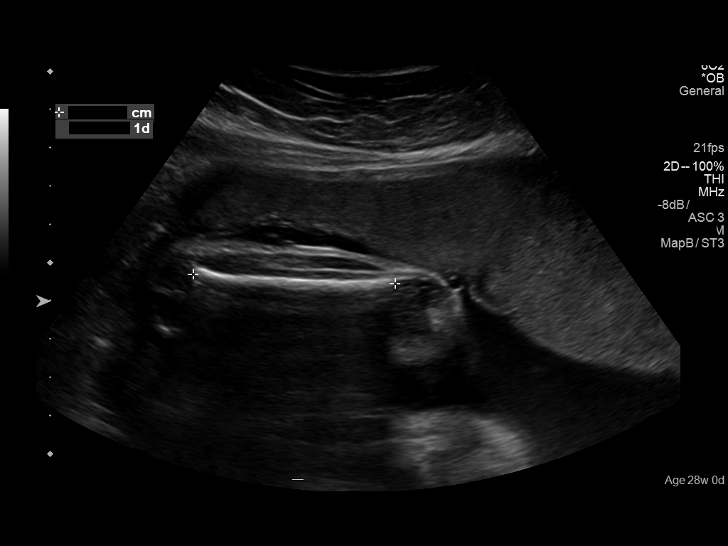
[im 51/98]
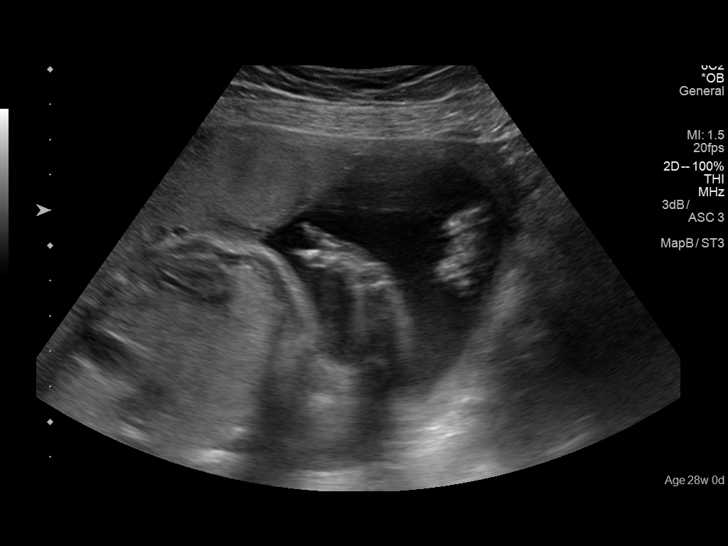
[im 58/98]
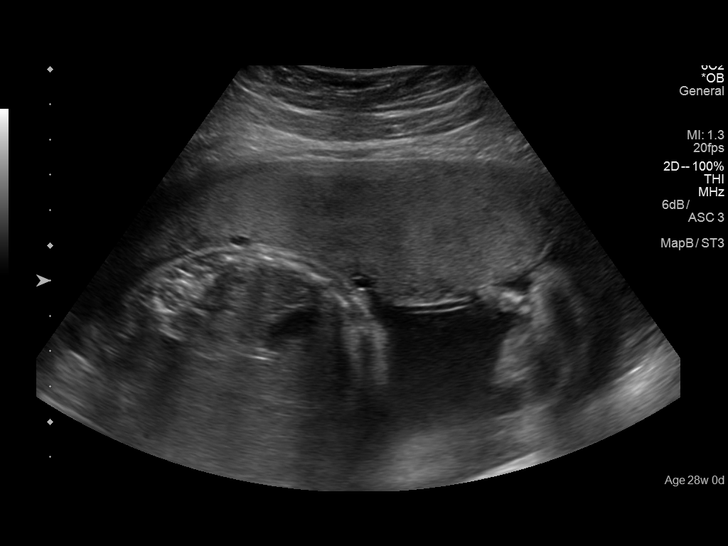
[im 65/98]
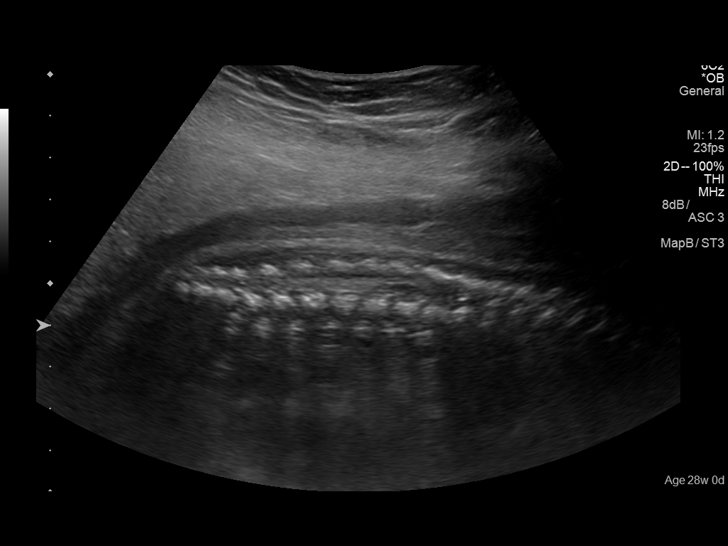
[im 72/98]
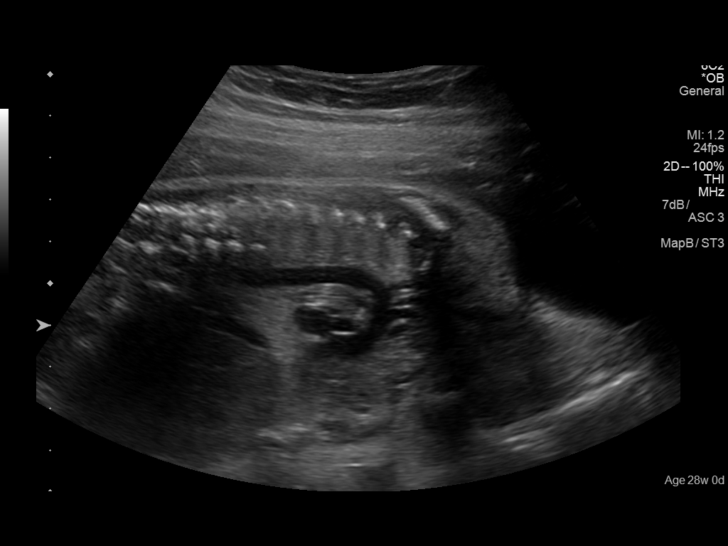
[im 80/98]
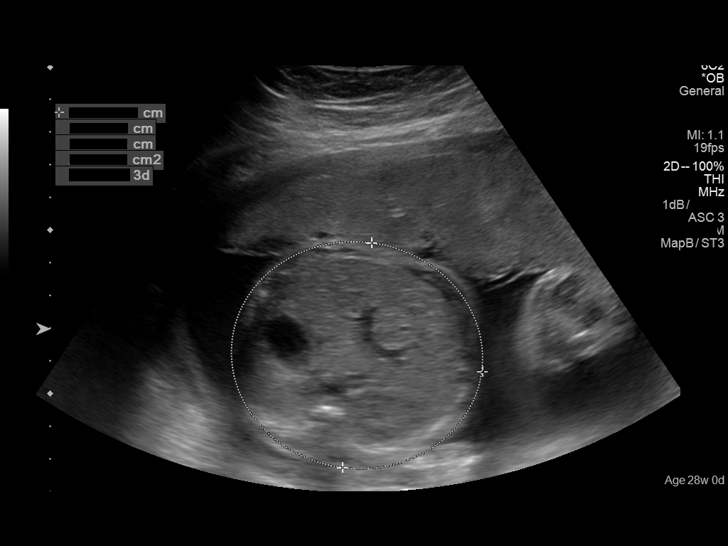
[im 87/98]
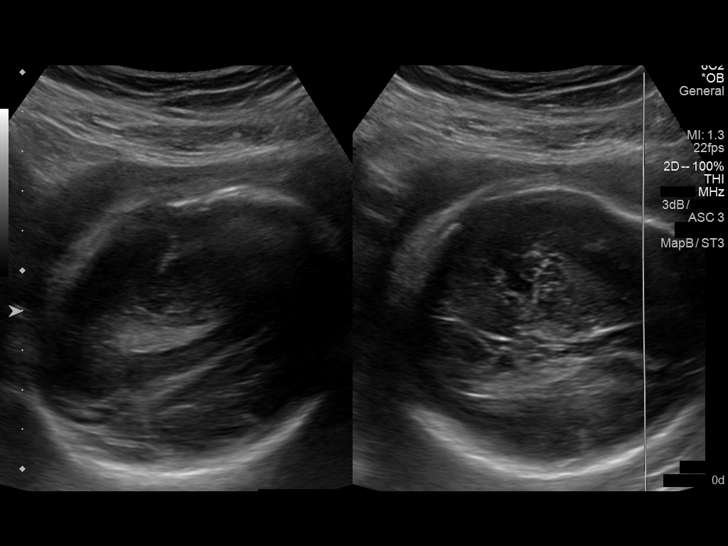
[im 94/98]
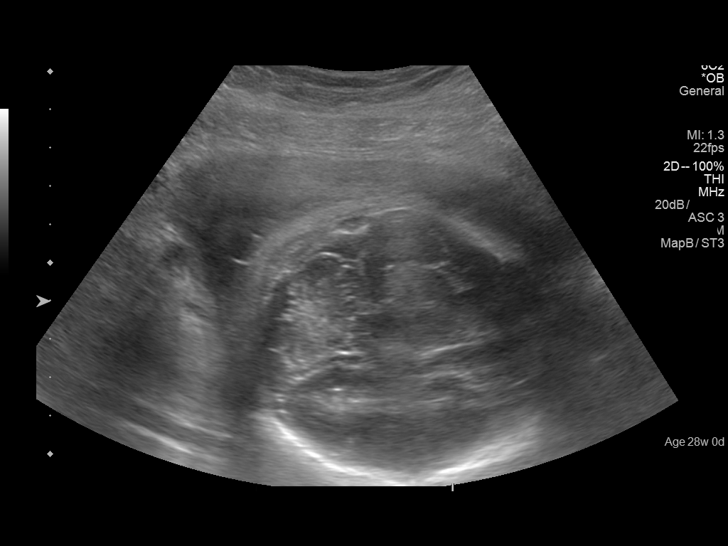

[13 of 28 positions shown; findings below may reference images not displayed]

FINDINGS: Number of Fetuses: 1

Heart Rate:  157 bpm

Movement: Yes

Presentation: Cephalic

Previa: No

Placental Location: Anterior

Amniotic Fluid (Subjective): Within normal limits

Amniotic Fluid (Objective):

AFI 16.0 cm (5%ile= 9.4 cm, 95%= 22.8 cm for 28 wks)

FETAL BIOMETRY

BPD:  7.0cm 28w 2d

HC:    25.9cm  28w   1d

AC:   23.1cm  27w   3d

FL:   5.3cm  28w   2d

Current Mean GA: 28w 0d              US EDC: 02/26/2017

Estimated Fetal Weight:  1,140g    32%ile

FETAL ANATOMY

Lateral Ventricles: Appears normal

Thalami/CSP: Appears normal

Posterior Fossa:  Appears normal

Nuchal Region: Appears normal

Upper Lip: Appears normal

Spine: Appears normal

4 Chamber Heart on Left: Appears normal

LVOT: Appears normal

RVOT: Appears normal

Stomach on Left: Appears normal

3 Vessel Cord: Appears normal

Cord Insertion site: Appears normal

Kidneys: Appears normal

Bladder: Appears normal

Extremities: Appears normal

Technically difficult due to: Advanced gestational age

Maternal Findings:

Cervix:  2.9 cm
IMPRESSION: Assigned gestational age is currently 28 weeks 0 days by prior
outside ultrasound. Appropriate fetal growth, with EFW currently at
32 percentile.

No fetal anomalies seen involving visualized anatomy noted above.

## 2020-02-16 ENCOUNTER — Telehealth: Payer: Self-pay

## 2020-02-16 NOTE — Telephone Encounter (Signed)
Patient called in stating that she was diagnosed with bipolar disorder and that her birth control was causing a "mess" with her hormones so she started taking her birth control differently. Patient was all over the place during this conversation but ultimately she wants to get off birth control however she is under how this with affect her hormones. Patient stated she "popped a gasket" this morning referring to loosing her temper with someone. I tried to get patient scheduled for an appointment however nothing worked with her scheduled. Patient requested a call back from the nurse so that she could at least get a basis of what she needs to do.  Could you please advise?

## 2020-02-16 NOTE — Telephone Encounter (Signed)
Spoke with patient and she is wanting to possibly go on Depo injection to stop her bleeding. Patient has an appointment with Dr. Logan Bores next week.

## 2020-02-24 ENCOUNTER — Other Ambulatory Visit: Payer: Self-pay

## 2020-02-24 ENCOUNTER — Encounter: Payer: Self-pay | Admitting: Obstetrics and Gynecology

## 2020-02-24 ENCOUNTER — Ambulatory Visit (INDEPENDENT_AMBULATORY_CARE_PROVIDER_SITE_OTHER): Payer: Commercial Managed Care - PPO | Admitting: Obstetrics and Gynecology

## 2020-02-24 VITALS — BP 119/82 | HR 86 | Ht 65.0 in | Wt 213.4 lb

## 2020-02-24 DIAGNOSIS — N921 Excessive and frequent menstruation with irregular cycle: Secondary | ICD-10-CM

## 2020-02-24 DIAGNOSIS — Z3009 Encounter for other general counseling and advice on contraception: Secondary | ICD-10-CM

## 2020-02-24 NOTE — Progress Notes (Signed)
HPI:      [name] is a 25 y.o. G1P0 who LMP was Patient's last menstrual period was 12/09/2019 (approximate).  Subjective:   She presents today stating that she has been taking OCPs in a continuous manner to avoid the "hormonal drop-off" that seems to affect her bipolar issues.  Unfortunately she has been having persistent intermittent breakthrough bleeding by using pills in this way.  She is also using OCPs for birth control.  She would like to discuss other birth control methods as well as obtaining adequate cycle control.    Hx: The following portions of the patient's history were reviewed and updated as appropriate:             She  has a past medical history of Anemia and Depression. She does not have a problem list on file. She  has a past surgical history that includes Eye surgery (Left). Her family history is not on file. She  reports that she has never smoked. She has never used smokeless tobacco. She reports current alcohol use. She reports that she does not use drugs. She has a current medication list which includes the following prescription(s): clonazepam, lamotrigine, and norgestimate-ethinyl estradiol. She has No Known Allergies.       Review of Systems:  Review of Systems  Constitutional: Denied constitutional symptoms, night sweats, recent illness, fatigue, fever, insomnia and weight loss.  Eyes: Denied eye symptoms, eye pain, photophobia, vision change and visual disturbance.  Ears/Nose/Throat/Neck: Denied ear, nose, throat or neck symptoms, hearing loss, nasal discharge, sinus congestion and sore throat.  Cardiovascular: Denied cardiovascular symptoms, arrhythmia, chest pain/pressure, edema, exercise intolerance, orthopnea and palpitations.  Respiratory: Denied pulmonary symptoms, asthma, pleuritic pain, productive sputum, cough, dyspnea and wheezing.  Gastrointestinal: Denied, gastro-esophageal reflux, melena, nausea and vomiting.  Genitourinary: See HPI  for additional information.  Musculoskeletal: Denied musculoskeletal symptoms, stiffness, swelling, muscle weakness and myalgia.  Dermatologic: Denied dermatology symptoms, rash and scar.  Neurologic: Denied neurology symptoms, dizziness, headache, neck pain and syncope.  Psychiatric: Denied psychiatric symptoms, anxiety and depression.  Endocrine: Denied endocrine symptoms including hot flashes and night sweats.   Meds:   Current Outpatient Medications on File Prior to Visit  Medication Sig Dispense Refill  . clonazePAM (KLONOPIN) 0.25 MG disintegrating tablet Take by mouth.    . lamoTRIgine (LAMICTAL) 200 MG tablet Take 200 mg by mouth daily.    . norgestimate-ethinyl estradiol (ORTHO-CYCLEN,SPRINTEC,PREVIFEM) 0.25-35 MG-MCG tablet Take 1 tablet by mouth daily.     No current facility-administered medications on file prior to visit.       The pregnancy intention screening data noted above was reviewed. Potential methods of contraception were discussed. The patient elected to proceed with IUD or IUS.     Objective:     Vitals:   02/24/20 1047  BP: 119/82  Pulse: 86   Filed Weights   02/24/20 1047  Weight: 213 lb 6.4 oz (96.8 kg)                Assessment:    G1P0 There are no problems to display for this patient.    1. Breakthrough bleeding on birth control pills   2. Birth control counseling        Plan:            1.  After long discussion patient has decided upon IUD for birth control.  She believes this is the best option for her to prevent hormonal effects from taking OCPs and to  give her birth control. Risks and benefits of IUD discussed in detail.   Hormonal birth control methods including Seasonique and cycle control pills with hormone during the off week were discussed in detail.    Orders No orders of the defined types were placed in this encounter.   No orders of the defined types were placed in this encounter.     F/U  Return for She is to  call at the start of next menses. I spent 24 minutes involved in the care of this patient preparing to see the patient by obtaining and reviewing her medical history (including labs, imaging tests and prior procedures), documenting clinical information in the electronic health record (EHR), counseling and coordinating care plans, writing and sending prescriptions, ordering tests or procedures and directly communicating with the patient by discussing pertinent items from her history and physical exam as well as detailing my assessment and plan as noted above so that she has an informed understanding.  All of her questions were answered.  Elonda Husky, M.D. 02/24/2020 11:12 AM

## 2020-03-01 ENCOUNTER — Ambulatory Visit (INDEPENDENT_AMBULATORY_CARE_PROVIDER_SITE_OTHER): Payer: Commercial Managed Care - PPO | Admitting: Obstetrics and Gynecology

## 2020-03-01 ENCOUNTER — Encounter: Payer: Self-pay | Admitting: Obstetrics and Gynecology

## 2020-03-01 ENCOUNTER — Other Ambulatory Visit: Payer: Self-pay

## 2020-03-01 VITALS — BP 122/91 | HR 88 | Ht 65.0 in | Wt 210.0 lb

## 2020-03-01 DIAGNOSIS — Z30430 Encounter for insertion of intrauterine contraceptive device: Secondary | ICD-10-CM

## 2020-03-01 DIAGNOSIS — N921 Excessive and frequent menstruation with irregular cycle: Secondary | ICD-10-CM

## 2020-03-01 NOTE — Progress Notes (Signed)
HPI:      Marcia Martinez is a 25 y.o. G1P0 who LMP was Patient's last menstrual period was 02/28/2020 (approximate).  Subjective:   She presents today for IUD insertion.  Patient has had poor cycle control and bleeding for 3 months despite taking OCPs.  She presents today for IUD to obtain better cycle control.    Hx: The following portions of the patient's history were reviewed and updated as appropriate:             She  has a past medical history of Anemia and Depression. She does not have a problem list on file. She  has a past surgical history that includes Eye surgery (Left). Her family history is not on file. She  reports that she has never smoked. She has never used smokeless tobacco. She reports current alcohol use. She reports that she does not use drugs. She has a current medication list which includes the following prescription(s): clonazepam and lamotrigine. She has No Known Allergies.       Review of Systems:  Review of Systems  Constitutional: Denied constitutional symptoms, night sweats, recent illness, fatigue, fever, insomnia and weight loss.  Eyes: Denied eye symptoms, eye pain, photophobia, vision change and visual disturbance.  Ears/Nose/Throat/Neck: Denied ear, nose, throat or neck symptoms, hearing loss, nasal discharge, sinus congestion and sore throat.  Cardiovascular: Denied cardiovascular symptoms, arrhythmia, chest pain/pressure, edema, exercise intolerance, orthopnea and palpitations.  Respiratory: Denied pulmonary symptoms, asthma, pleuritic pain, productive sputum, cough, dyspnea and wheezing.  Gastrointestinal: Denied, gastro-esophageal reflux, melena, nausea and vomiting.  Genitourinary: Denied genitourinary symptoms including symptomatic vaginal discharge, pelvic relaxation issues, and urinary complaints.  Musculoskeletal: Denied musculoskeletal symptoms, stiffness, swelling, muscle weakness and myalgia.  Dermatologic: Denied dermatology symptoms,  rash and scar.  Neurologic: Denied neurology symptoms, dizziness, headache, neck pain and syncope.  Psychiatric: Denied psychiatric symptoms, anxiety and depression.  Endocrine: Denied endocrine symptoms including hot flashes and night sweats.   Meds:   Current Outpatient Medications on File Prior to Visit  Medication Sig Dispense Refill  . clonazePAM (KLONOPIN) 0.25 MG disintegrating tablet Take by mouth.    . lamoTRIgine (LAMICTAL) 200 MG tablet Take 200 mg by mouth daily.     No current facility-administered medications on file prior to visit.    Objective:     Vitals:   03/01/20 1403  BP: (!) 122/91  Pulse: 88    Physical examination   Pelvic:   Vulva: Normal appearance.  No lesions.  Vagina: No lesions or abnormalities noted.  Support: Normal pelvic support.  Urethra No masses tenderness or scarring.  Meatus Normal size without lesions or prolapse.  Cervix: Normal appearance.  No lesions.  Anus: Normal exam.  No lesions.  Perineum: Normal exam.  No lesions.        Bimanual   Uterus: Normal size.  Non-tender.  Mobile.  AV.  Adnexae: No masses.  Non-tender to palpation.  Cul-de-sac: Negative for abnormality.   IUD Procedure Pt has read the booklet and signed the appropriate forms regarding the Mirena IUD.  All of her questions have been answered.   The cervix was cleansed with betadine solution.  After sounding the uterus and noting the position, the IUD was placed in the usual manner without problem.  The string was cut to the appropriate length.  The patient tolerated the procedure well.            NDC # = [id number]   Assessment:    G1P0  There are no problems to display for this patient.    1. Encounter for insertion of mirena IUD   2. Breakthrough bleeding on birth control pills       Plan:             F/U  Return in about 1 month (around 04/01/2020) for For IUD f/u.  Elonda Husky, M.D. 03/01/2020 2:23 PM

## 2020-03-08 ENCOUNTER — Telehealth: Payer: Self-pay

## 2020-03-08 NOTE — Telephone Encounter (Signed)
Please advise 

## 2020-03-08 NOTE — Telephone Encounter (Signed)
Patient called in stating that her and her provider had discussed a pill he could prescribe her to help with the vaginal bleeding, patient would like that called into her pharmacy. Could you please advise?

## 2020-03-09 NOTE — Telephone Encounter (Signed)
Spoke with patient and she stated that she is going through two super tampons a day. She said that her vagina is hurting and she had bleeding three months prior to the IUD insert. She said that you are aware of this and had told her that you would give her the pills to help with the bleeding.

## 2020-03-11 MED ORDER — NORETHINDRONE ACETATE 5 MG PO TABS: 5.0000 mg | ORAL_TABLET | Freq: Every day | ORAL | 0 refills | Status: AC

## 2020-03-11 NOTE — Telephone Encounter (Signed)
Tried to call patient to let her know that prescription has been sent in. Mail box was full and unable to leave message. Patient does not have my chart.

## 2020-03-11 NOTE — Telephone Encounter (Signed)
Notified patient that prescription has been sent to the pharmacy.   

## 2020-03-11 NOTE — Addendum Note (Signed)
Addended by: Dorian Pod on: 03/11/2020 08:46 AM   Modules accepted: Orders

## 2020-03-22 ENCOUNTER — Telehealth: Payer: Self-pay

## 2020-03-22 NOTE — Telephone Encounter (Signed)
Mailbox full

## 2020-03-22 NOTE — Telephone Encounter (Signed)
Pt called in and stated that she had a IUD put in and since then she has noticed that hair on her head in falling out. The pt is wanting to know is that normal for a IUD placed. The pt is concerned and would like a call back. Please advise

## 2020-03-22 NOTE — Telephone Encounter (Signed)
Pt was returning call from nurse. Called back no answer. Told pt I will send a message and that they will return your call. Please advise

## 2020-03-23 NOTE — Telephone Encounter (Signed)
Please see other phone message  

## 2020-03-23 NOTE — Telephone Encounter (Signed)
Spoke with patient and let her know that Dr. Logan Bores said that the IUD is not the cause for the hair loss. Patient verbalized understanding.

## 2020-04-06 ENCOUNTER — Ambulatory Visit (INDEPENDENT_AMBULATORY_CARE_PROVIDER_SITE_OTHER): Payer: Commercial Managed Care - PPO | Admitting: Obstetrics and Gynecology

## 2020-04-06 ENCOUNTER — Other Ambulatory Visit: Payer: Self-pay

## 2020-04-06 ENCOUNTER — Encounter: Payer: Self-pay | Admitting: Obstetrics and Gynecology

## 2020-04-06 VITALS — BP 110/82 | HR 94 | Ht 65.0 in | Wt 209.3 lb

## 2020-04-06 DIAGNOSIS — Z30431 Encounter for routine checking of intrauterine contraceptive device: Secondary | ICD-10-CM | POA: Diagnosis not present

## 2020-04-06 NOTE — Progress Notes (Signed)
HPI:      [name] is a 25 y.o. G1P0 who LMP was No LMP recorded. (Menstrual status: IUD).  Subjective:   She presents today for follow-up of her IUD.  She reports no problems with her strings.  She has had intercourse without issue.  She has had some intermittent bleeding but not significant.  She is happy with her IUD.    Hx: The following portions of the patient's history were reviewed and updated as appropriate:             She  has a past medical history of Anemia and Depression. She does not have a problem list on file. She  has a past surgical history that includes Eye surgery (Left). Her family history is not on file. She  reports that she has never smoked. She has never used smokeless tobacco. She reports current alcohol use. She reports that she does not use drugs. She has a current medication list which includes the following prescription(s): clonazepam, lamotrigine, levonorgestrel, and norethindrone. She has No Known Allergies.       Review of Systems:  Review of Systems  Constitutional: Denied constitutional symptoms, night sweats, recent illness, fatigue, fever, insomnia and weight loss.  Eyes: Denied eye symptoms, eye pain, photophobia, vision change and visual disturbance.  Ears/Nose/Throat/Neck: Denied ear, nose, throat or neck symptoms, hearing loss, nasal discharge, sinus congestion and sore throat.  Cardiovascular: Denied cardiovascular symptoms, arrhythmia, chest pain/pressure, edema, exercise intolerance, orthopnea and palpitations.  Respiratory: Denied pulmonary symptoms, asthma, pleuritic pain, productive sputum, cough, dyspnea and wheezing.  Gastrointestinal: Denied, gastro-esophageal reflux, melena, nausea and vomiting.  Genitourinary: Denied genitourinary symptoms including symptomatic vaginal discharge, pelvic relaxation issues, and urinary complaints.  Musculoskeletal: Denied musculoskeletal symptoms, stiffness, swelling, muscle weakness and  myalgia.  Dermatologic: Denied dermatology symptoms, rash and scar.  Neurologic: Denied neurology symptoms, dizziness, headache, neck pain and syncope.  Psychiatric: Denied psychiatric symptoms, anxiety and depression.  Endocrine: Denied endocrine symptoms including hot flashes and night sweats.   Meds:   Current Outpatient Medications on File Prior to Visit  Medication Sig Dispense Refill  . clonazePAM (KLONOPIN) 0.25 MG disintegrating tablet Take by mouth.    . lamoTRIgine (LAMICTAL) 200 MG tablet Take 200 mg by mouth daily.    Marland Kitchen levonorgestrel (MIRENA) 20 MCG/24HR IUD 1 each by Intrauterine route once.    . norethindrone (AYGESTIN) 5 MG tablet Take 1 tablet (5 mg total) by mouth daily for 21 days. As directed 20 tablet 0   No current facility-administered medications on file prior to visit.       The pregnancy intention screening data noted above was reviewed. Potential methods of contraception were discussed. The patient elected to proceed with IUD or IUS.     Objective:     Vitals:   04/06/20 1415  BP: 110/82  Pulse: 94   Filed Weights   04/06/20 1415  Weight: 209 lb 4.8 oz (94.9 kg)              Physical examination   Pelvic:   Vulva: Normal appearance.  No lesions.  Vagina: No lesions or abnormalities noted.  Support: Normal pelvic support.  Urethra No masses tenderness or scarring.  Meatus Normal size without lesions or prolapse.  Cervix: Normal appearance.  No lesions. IUD strings noted at cervical os.  Anus: Normal exam.  No lesions.  Perineum: Normal exam.  No lesions.        Bimanual   Uterus: Normal size.  Non-tender.  Mobile.  AV.  Adnexae: No masses.  Non-tender to palpation.  Cul-de-sac: Negative for abnormality.     Assessment:    G1P0 There are no problems to display for this patient.    1. Surveillance of previously prescribed intrauterine contraceptive device        Plan:            1.  Follow-up for annual exam. Orders No  orders of the defined types were placed in this encounter.   No orders of the defined types were placed in this encounter.     F/U  Return for Annual Physical. I spent 12 minutes involved in the care of this patient preparing to see the patient by obtaining and reviewing her medical history (including labs, imaging tests and prior procedures), documenting clinical information in the electronic health record (EHR), counseling and coordinating care plans, writing and sending prescriptions, ordering tests or procedures and directly communicating with the patient by discussing pertinent items from her history and physical exam as well as detailing my assessment and plan as noted above so that she has an informed understanding.  All of her questions were answered.  Marcia Martinez, M.D. 04/06/2020 2:33 PM

## 2020-10-28 ENCOUNTER — Telehealth: Payer: Self-pay | Admitting: Obstetrics and Gynecology

## 2020-10-28 NOTE — Telephone Encounter (Signed)
I tried to reach out to patient to let her know that bleeding is normal with the Mirena. Bleeding can last up to 6 months in some women. With continuous use bleeding will become lighter. There was no answer, no vm available.

## 2020-10-28 NOTE — Telephone Encounter (Signed)
Patient states she had the Mirena inserted back in January.  Patient states she has been bleeding every month  and the bleeding lasts a week to a week and a half.  Patient states she is so confused and has no idea what is going on with the Mirena.  Patient states she thought she is only supposed to have a cycle every 3 months.  Patient states she is irritated and frustrated because she feels she was never told what to do next.  Please advise.
